# Patient Record
Sex: Male | Born: 2000 | Race: White | Hispanic: No | Marital: Single
Health system: Southern US, Community
[De-identification: ages and names within clinical notes are randomized; demographics above are authoritative.]

---
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
## 2021-11-29 ENCOUNTER — Ambulatory Visit (INDEPENDENT_AMBULATORY_CARE_PROVIDER_SITE_OTHER): Payer: 59 | Admitting: Family Medicine

## 2021-11-29 VITALS — BP 112/60 | Ht 72.0 in | Wt 185.0 lb

## 2021-11-29 DIAGNOSIS — S83004D Unspecified dislocation of right patella, subsequent encounter: Secondary | ICD-10-CM

## 2021-11-29 DIAGNOSIS — S83004S Unspecified dislocation of right patella, sequela: Secondary | ICD-10-CM

## 2021-11-29 DIAGNOSIS — S83004A Unspecified dislocation of right patella, initial encounter: Secondary | ICD-10-CM | POA: Insufficient documentation

## 2021-11-29 NOTE — Progress Notes (Signed)
   New Patient Office Visit  Subjective   Patient ID: Kenneth Jacobson, male    DOB: April 03, 2000  Age: 21 y.o. MRN: [id number]  R knee pain.  He presents today with chief complaint of R knee pain after injury 1 year ago. He was skateboarding and had a twisting injury that caused a patellar dislocation. He was placed in a brace and completed home therapy. He experiences occasional medial knee pain after prolonged standing. He has not returned to running or skateboarding secondary to hesitancy, fear of  repeat dislocation. He denies locking, popping or instability.   ROS as listed above in HPI    Objective:     BP 112/60   Ht 6' (1.829 m)   Wt 185 lb (83.9 kg)   BMI 25.09 kg/m   Physical Exam Vitals reviewed.  Constitutional:      General: He is not in acute distress.    Appearance: Normal appearance. He is not ill-appearing, toxic-appearing or diaphoretic.  Pulmonary:     Effort: Pulmonary effort is normal.  Neurological:     Mental Status: He is alert.   Right knee: No obvious deformity or asymmetry.  No ecchymosis or effusion.  He has very slight tenderness to palpation along the medial joint line.  No tenderness to palpation the medial or lateral patellar facet.  He has minimally decreased range of motion from 0 to 140 degrees.  Left knee range of motion from 0-160.  Strength 5/5 knee flexion and extension.  Stable to varus and valgus stress.  Negative Lachman's.  Negative McMurray's. I can translate him approximately 2 quadrants with negative apprehension.  No J sign appreciated.  Good quad tone appreciated, equivocal with left.  Ultrasound of Knee-right  Patellar tendon -visualized, no obvious abnormality Quad tendon -visualized, no obvious abnormality Suprapatellar pouch effusion-visualized, no hypoechoic fluid collection seen Medial meniscus-visualized, no abnormality seen Lateral meniscus-visualized, no abnormality seen  Summary and additional findings-grossly normal  knee ultrasound without effusion.   Assessment & Plan:   Problem List Items Addressed This Visit       Musculoskeletal and Integument   Dislocation of right patella - Primary    He has a history of 1 patellar dislocation 1 year ago with no further instability.  Discussed with patient he has had a slight increased risk for recurrence and continuing with aquatic rehab and strengthening will be key to his rehabilitation.  Discussed with him slowly returning back to running and skateboarding as tolerated.  He verbalized understanding.       Return if symptoms worsen or fail to improve.    Claudie Leach, DO

## 2021-11-29 NOTE — Assessment & Plan Note (Signed)
He has a history of 1 patellar dislocation 1 year ago with no further instability.  Discussed with patient he has had a slight increased risk for recurrence and continuing with aquatic rehab and strengthening will be key to his rehabilitation.  Discussed with him slowly returning back to running and skateboarding as tolerated.  He verbalized understanding.

## 2021-11-30 ENCOUNTER — Encounter: Payer: Self-pay | Admitting: Family Medicine

## 2022-09-12 ENCOUNTER — Ambulatory Visit: Payer: 59 | Admitting: Podiatry

## 2022-09-12 DIAGNOSIS — B070 Plantar wart: Secondary | ICD-10-CM | POA: Diagnosis not present

## 2022-09-13 NOTE — Progress Notes (Signed)
Subjective:   Patient ID: Kenneth Jacobson, male   DOB: 22 y.o.   MRN: [id number]   HPI Patient presents with a severe lesion plantar aspect left foot that is very painful when pressed and has been present for several years.  States is very sore to walk on it has gotten gradually worse has tried over-the-counter medicines and some topical salicylic acid.  Patient does not smoke likes to be active   Review of Systems  All other systems reviewed and are negative.       Objective:  Physical Exam Vitals and nursing note reviewed.  Constitutional:      Appearance: He is well-developed.  Pulmonary:     Effort: Pulmonary effort is normal.  Musculoskeletal:        General: Normal range of motion.  Skin:    General: Skin is warm.  Neurological:     Mental Status: He is alert.     Neurovascular status found to be intact muscle strength was found to be adequate range of motion within normal limits.  Patient is found to have a very large deep lesion plantar aspect left heel measuring about 2.0 x 1.5 cm that upon debridement shows pinpoint bleeding and pain to lateral pressure.  Patient has good digital perfusion well-oriented     Assessment:  Verruca plantaris plantar aspect left foot      Plan:  H&P condition reviewed and due to the intense nature and long-term nature I recommended excision.  I did explain procedure risk he wants the procedure done and I anesthetized with 100 mg Xylocaine with epinephrine.  I then went ahead and utilizing sharp dissection I circumscribed this and then removed it entirely down to the epidermal dermal junction and applied a small amount of phenol to kill any remaining cells.  Applied sterile dressing instructed on elevation padding the area and patient will be seen back to recheck

## 2022-09-15 ENCOUNTER — Encounter: Payer: Self-pay | Admitting: Podiatry

## 2023-04-16 ENCOUNTER — Other Ambulatory Visit: Payer: Self-pay | Admitting: Podiatry
